# Patient Record
Sex: Female | Born: 1960 | Race: White | Hispanic: No | Marital: Single | Smoking: Former smoker
Health system: Southern US, Community
[De-identification: ages and names within clinical notes are randomized; demographics above are authoritative.]

## PROBLEM LIST (undated history)

## (undated) HISTORY — PX: ABDOMINAL HYSTERECTOMY: SHX81

## (undated) HISTORY — PX: TONSILLECTOMY: SUR1361

---

## 2018-12-17 ENCOUNTER — Encounter: Payer: Self-pay | Admitting: Emergency Medicine

## 2018-12-17 ENCOUNTER — Ambulatory Visit
Admission: EM | Admit: 2018-12-17 | Discharge: 2018-12-17 | Disposition: A | Discharge status: Home / Self Care | Payer: Self-pay

## 2018-12-17 ENCOUNTER — Other Ambulatory Visit: Payer: Self-pay

## 2018-12-17 ENCOUNTER — Ambulatory Visit (INDEPENDENT_AMBULATORY_CARE_PROVIDER_SITE_OTHER): Payer: Self-pay

## 2018-12-17 DIAGNOSIS — M79645 Pain in left finger(s): Secondary | ICD-10-CM

## 2018-12-17 DIAGNOSIS — W11XXXA Fall on and from ladder, initial encounter: Secondary | ICD-10-CM

## 2018-12-17 DIAGNOSIS — S62655A Nondisplaced fracture of medial phalanx of left ring finger, initial encounter for closed fracture: Secondary | ICD-10-CM

## 2018-12-17 NOTE — ED Provider Notes (Signed)
[facility] [place] [facility] [place]   Name: Olivia Knox DOB: 1961/01/25 MRN: [id number] CSN: [id number] PCP: Patient, No Pcp Per  Arrival date and time:  12/17/18 1442  Chief Complaint:  Finger Injury (left 4th finger)   NOTE: Prior to seeing the patient today, I have reviewed the triage nursing documentation and vital signs. Clinical staff has updated patient's PMH/PSHx, current medication list, and drug allergies/intolerances to ensure comprehensive history available to assist in medical decision making.   History:   HPI: Olivia Knox is a 58 y.o. female who presents today with complaints of pain to her left ring finger after falling off a ladder. It was an unwitnessed fall, but no LOC reported. Patient isn't exactly sure how she fell onto her hand. No medications or interventions done since fall. No previous injuries to that hand/finger.   History reviewed. No pertinent past medical history.  Past Surgical History:  Procedure Laterality Date  . ABDOMINAL HYSTERECTOMY    . TONSILLECTOMY      Family History  Problem Relation Age of Onset  . Cancer Mother   . Healthy Father     Social History   Tobacco Use  . Smoking status: Former Games developer  . Smokeless tobacco: Never Used  Substance Use Topics  . Alcohol use: Yes  . Drug use: Never    There are no active problems to display for this patient.   Home Medications:    No outpatient medications have been marked as taking for the 12/17/18 encounter [facility] Encounter).    Allergies:   Patient has no known allergies.  Review of Systems (ROS): Review of Systems  Musculoskeletal: Positive for arthralgias and myalgias.  All other systems reviewed and are negative.    Vital Signs: Today's Vitals   12/17/18 1449 12/17/18 1450 12/17/18 1454 12/17/18 1528  BP:   (!) 134/92   Pulse:   70   Resp:   16   Temp:   98.3 F (36.8 C)   TempSrc:   Oral   SpO2:   97%   Weight:  180 lb (81.6 kg)    Height:  5\' 7"   (1.702 m)    PainSc: 1    1     Physical Exam: Physical Exam Vitals signs and nursing note reviewed.  Constitutional:      Appearance: Normal appearance.  Musculoskeletal:     Left hand: She exhibits tenderness. Decreased sensation noted. Decreased strength noted. She exhibits finger abduction.       Hands:  Neurological:     Mental Status: She is alert.  Psychiatric:        Mood and Affect: Mood normal.        Behavior: Behavior normal.      Urgent Care Treatments / Results:   LABS: PLEASE NOTE: all labs that were ordered this encounter are listed, however only abnormal results are displayed. Labs Reviewed - No data to display  EKG: -None  RADIOLOGY: Dg Finger Ring Left  Result Date: 12/17/2018 CLINICAL DATA:  Pain after falling from ladder EXAM: LEFT RING FINGER 2+V COMPARISON:  None. FINDINGS: Frontal, oblique, and lateral views were obtained. There is an obliquely oriented fracture of the distal aspect of the fourth middle phalanx with alignment essentially anatomic. There is also an obliquely oriented fracture along the medial most aspect the fourth distal phalanx extending to the fourth DIP joint. No other fracture. No dislocation. Joint spaces appear normal. No erosive change. IMPRESSION: 1. Obliquely oriented fracture distal  aspect fourth middle phalanx with alignment essentially anatomic. 2. Obliquely oriented fracture along the medial most aspect of the fourth distal phalanx extending to the fourth DIP joint. 3.  No other fracture.  No dislocation.  No evident arthropathy. These results will be called to the ordering clinician or representative by the Radiologist Assistant, and communication documented in the PACS or zVision Dashboard. Electronically Signed   By: Lowella Grip III M.D.   On: 12/17/2018 15:26    PROCEDURES: Procedures  MEDICATIONS RECEIVED THIS VISIT: Medications - No data to display  PERTINENT CLINICAL COURSE NOTES/UPDATES:   Initial  Impression / Assessment and Plan / Urgent Care Course:  Pertinent labs & imaging results that were available during my care of the patient were personally reviewed by me and considered in my medical decision making (see lab/imaging section of note for values and interpretations).  [name] is a 58 y.o. female who presents to [facility] today with complaints of pain to left finger, diagnosed with a fracture to middle phalanx of left ring finger, and treated as such with the directions below. Patient supplied her own splint. NP and patient reviewed discharge instructions below during visit.   Patient is well appearing overall in clinic today. She does not appear to be in any acute distress. Presenting symptoms (see HPI) and exam as documented above.   I have reviewed the follow up and strict return precautions for any new or worsening symptoms. Patient is aware of symptoms that would be deemed urgent/emergent, and would thus require further evaluation either here or in the emergency department. At the time of discharge, she verbalized understanding and consent with the discharge plan as it was reviewed with her. All questions were fielded by provider and/or clinic staff prior to patient discharge.    Final Clinical Impressions / Urgent Care Diagnoses:   Final diagnoses:  Nondisplaced fracture of middle phalanx of left ring finger, initial encounter for closed fracture    New Prescriptions:  [place] Controlled Substance Registry consulted? Not Applicable  No orders of the defined types were placed in this encounter.     Discharge Instructions     You broke the middle bone of your left index finger on your left hand.   Keep the splint on during the day.   Follow-up with an orthopedic as soon as you can ([facility]).   Take 600-800mg  (3-4 pills) of ibuprofen, three times a day with food to treat pain.     Recommended Follow up Care:  Patient encouraged to follow up with the  following provider within the specified time frame, or sooner as dictated by the severity of her symptoms. As always, she was instructed that for any urgent/emergent care needs, she should seek care either here or in the emergency department for more immediate evaluation.   Gertie Baron, DNP, NP-c    Gertie Baron, NP 12/17/18 [phone]

## 2018-12-17 NOTE — ED Notes (Signed)
Patient brought her own finger splint.  Her finger splint was applied to her right fourth finger.

## 2018-12-17 NOTE — ED Triage Notes (Signed)
Patient states that she fell off a ladder inside her house today.  Patient c/o pain and swelling in her left 4th finger.

## 2018-12-17 NOTE — Discharge Instructions (Addendum)
You broke the middle bone of your left index finger on your left hand.   Keep the splint on during the day.   Follow-up with an orthopedic as soon as you can ([facility]).   Take 600-800mg  (3-4 pills) of ibuprofen, three times a day with food to treat pain.

## 2020-03-02 ENCOUNTER — Other Ambulatory Visit: Payer: Self-pay

## 2020-03-02 DIAGNOSIS — Z20822 Contact with and (suspected) exposure to covid-19: Secondary | ICD-10-CM

## 2020-03-05 LAB — NOVEL CORONAVIRUS, NAA: SARS-CoV-2, NAA: DETECTED — AB

## 2021-05-11 IMAGING — CR DG FINGER RING 2+V*L*
3 series · 3 of 3 positions shown · non-contrast
Comparison: None.

CLINICAL DATA: Pain after falling from ladder

EXAM:
LEFT RING FINGER 2+V

[finger ap]
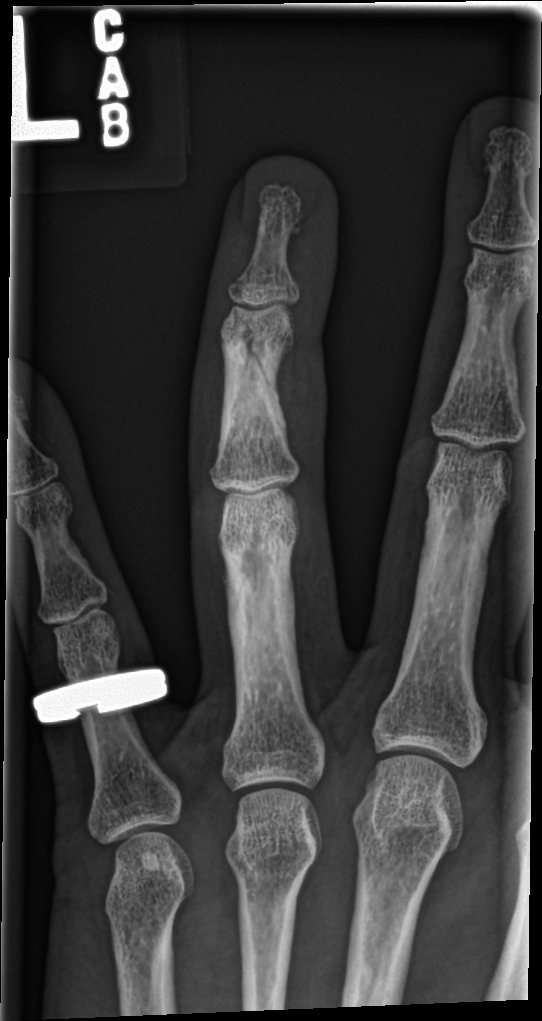

[finger obl]
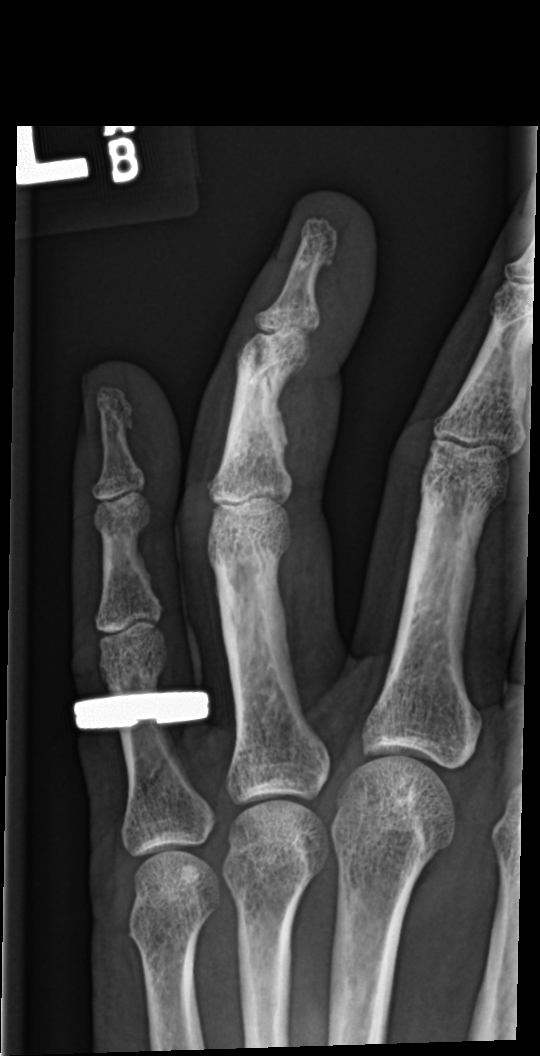

[finger lat]
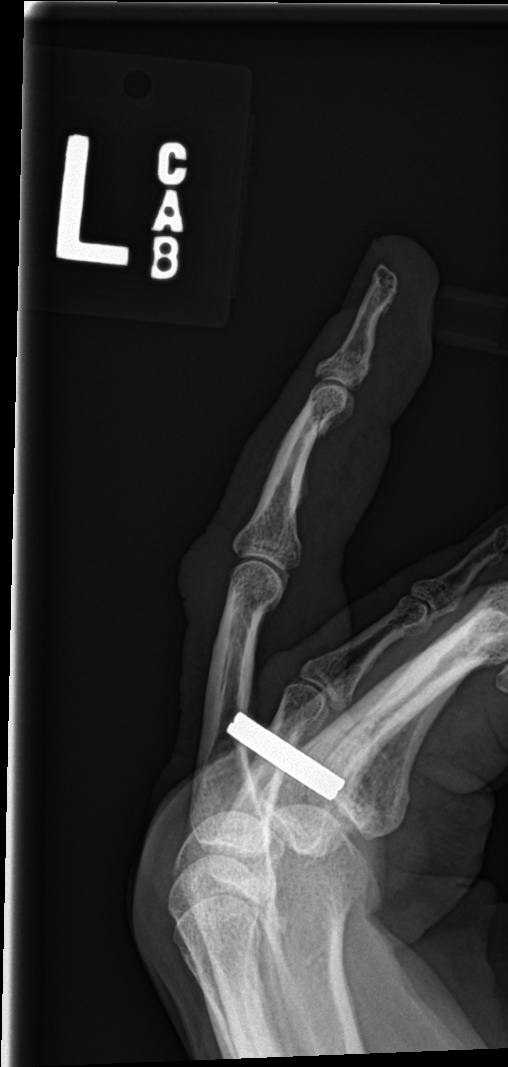

[3 of 3 positions shown; findings below may reference images not displayed]

FINDINGS: Frontal, oblique, and lateral views were obtained. There is an
obliquely oriented fracture of the distal aspect of the fourth
middle phalanx with alignment essentially anatomic. There is also an
obliquely oriented fracture along the medial most aspect the fourth
distal phalanx extending to the fourth DIP joint. No other fracture.
No dislocation. Joint spaces appear normal. No erosive change.
IMPRESSION: 1. Obliquely oriented fracture distal aspect fourth middle phalanx
with alignment essentially anatomic.

2. Obliquely oriented fracture along the medial most aspect of the
fourth distal phalanx extending to the fourth DIP joint.

3.  No other fracture.  No dislocation.  No evident arthropathy.

These results will be called to the ordering clinician or
representative by the Radiologist Assistant, and communication
documented in the PACS or zVision Dashboard.

## 2023-08-01 ENCOUNTER — Other Ambulatory Visit: Payer: Self-pay
